# Patient Record
Sex: Female | Born: 1980 | Race: White | Hispanic: No | Marital: Single | Smoking: Current every day smoker
Health system: Southern US, Community
[De-identification: ages and names within clinical notes are randomized; demographics above are authoritative.]

## PROBLEM LIST (undated history)

## (undated) DIAGNOSIS — D649 Anemia, unspecified: Secondary | ICD-10-CM

## (undated) DIAGNOSIS — F419 Anxiety disorder, unspecified: Secondary | ICD-10-CM

## (undated) DIAGNOSIS — E162 Hypoglycemia, unspecified: Secondary | ICD-10-CM

## (undated) DIAGNOSIS — J189 Pneumonia, unspecified organism: Secondary | ICD-10-CM

## (undated) DIAGNOSIS — M1990 Unspecified osteoarthritis, unspecified site: Secondary | ICD-10-CM

## (undated) DIAGNOSIS — K219 Gastro-esophageal reflux disease without esophagitis: Secondary | ICD-10-CM

## (undated) DIAGNOSIS — M797 Fibromyalgia: Secondary | ICD-10-CM

## (undated) HISTORY — PX: TONSILLECTOMY: SUR1361

## (undated) HISTORY — PX: ADENOIDECTOMY: SUR15

## (undated) HISTORY — PX: BELPHAROPTOSIS REPAIR: SHX369

---

## 2015-02-14 ENCOUNTER — Emergency Department (HOSPITAL_COMMUNITY)
Admission: EM | Admit: 2015-02-14 | Discharge: 2015-02-14 | Disposition: A | Discharge status: Home / Self Care | Payer: Self-pay | Attending: Emergency Medicine | Admitting: Emergency Medicine

## 2015-02-14 ENCOUNTER — Encounter (HOSPITAL_COMMUNITY): Payer: Self-pay | Admitting: Emergency Medicine

## 2015-02-14 ENCOUNTER — Emergency Department (HOSPITAL_COMMUNITY): Payer: Self-pay

## 2015-02-14 DIAGNOSIS — S61212A Laceration without foreign body of right middle finger without damage to nail, initial encounter: Secondary | ICD-10-CM | POA: Insufficient documentation

## 2015-02-14 DIAGNOSIS — Y929 Unspecified place or not applicable: Secondary | ICD-10-CM | POA: Insufficient documentation

## 2015-02-14 DIAGNOSIS — Y288XXA Contact with other sharp object, undetermined intent, initial encounter: Secondary | ICD-10-CM | POA: Insufficient documentation

## 2015-02-14 DIAGNOSIS — Z8639 Personal history of other endocrine, nutritional and metabolic disease: Secondary | ICD-10-CM | POA: Insufficient documentation

## 2015-02-14 DIAGNOSIS — IMO0002 Reserved for concepts with insufficient information to code with codable children: Secondary | ICD-10-CM

## 2015-02-14 DIAGNOSIS — Z880 Allergy status to penicillin: Secondary | ICD-10-CM | POA: Insufficient documentation

## 2015-02-14 DIAGNOSIS — Z720 Tobacco use: Secondary | ICD-10-CM | POA: Insufficient documentation

## 2015-02-14 DIAGNOSIS — Z23 Encounter for immunization: Secondary | ICD-10-CM | POA: Insufficient documentation

## 2015-02-14 DIAGNOSIS — Y9389 Activity, other specified: Secondary | ICD-10-CM | POA: Insufficient documentation

## 2015-02-14 DIAGNOSIS — Z8739 Personal history of other diseases of the musculoskeletal system and connective tissue: Secondary | ICD-10-CM | POA: Insufficient documentation

## 2015-02-14 DIAGNOSIS — Y990 Civilian activity done for income or pay: Secondary | ICD-10-CM | POA: Insufficient documentation

## 2015-02-14 HISTORY — DX: Hypoglycemia, unspecified: E16.2

## 2015-02-14 HISTORY — DX: Fibromyalgia: M79.7

## 2015-02-14 LAB — CBG MONITORING, ED: Glucose-Capillary: 156 mg/dL — ABNORMAL HIGH (ref 70–99)

## 2015-02-14 MED ORDER — TETANUS-DIPHTH-ACELL PERTUSSIS 5-2.5-18.5 LF-MCG/0.5 IM SUSP
0.5000 mL | Freq: Once | INTRAMUSCULAR | Status: AC
  Administered 2015-02-14: 0.5 mL via INTRAMUSCULAR
  Filled 2015-02-14: qty 0.5

## 2015-02-14 MED ORDER — LIDOCAINE HCL (PF) 1 % IJ SOLN
5.0000 mL | Freq: Once | INTRAMUSCULAR | Status: AC
  Administered 2015-02-14: 5 mL
  Filled 2015-02-14: qty 5

## 2015-02-14 MED ORDER — CEPHALEXIN 500 MG PO CAPS: 500.0000 mg | ORAL_CAPSULE | Freq: Four times a day (QID) | ORAL | Status: AC

## 2015-02-14 MED ORDER — ALPRAZOLAM 0.25 MG PO TABS
0.2500 mg | ORAL_TABLET | Freq: Once | ORAL | Status: AC
  Administered 2015-02-14: 0.25 mg via ORAL
  Filled 2015-02-14: qty 1

## 2015-02-14 NOTE — ED Provider Notes (Signed)
CSN: [id number]     Arrival date & time 02/14/15  1503 History  This chart was scribed for non-physician practitioner, Raymon Mutton, PA-C working with Gerhard Munch, MD by Greggory Stallion, ED scribe. This patient was seen in room TR11C/TR11C and the patient's care was started at 4:25 PM.   Chief Complaint  Patient presents with  . Laceration   The history is provided by the patient. No language interpreter was used.    HPI Comments: Melissa Montgomery is a 34 y.o. female with past medical history of hypoglycemia and fibromyalgia who presents to the Emergency Department complaining of right middle finger laceration that occurred about 2 hours ago. She states she was trying to break a metal piece at work and accidentally cut her finger on it. Pt cleaned and bandaged the wound immediately afterwards. She reports numbness and tingling in the finger with decreased ROM to the finger. Her last tetanus was in 2007. She denies wrist pain, injury to finger in past. PCP none   Past Medical History  Diagnosis Date  . Fibromyalgia   . Hypoglycemia    Past Surgical History  Procedure Laterality Date  . Tonsillectomy    . Belpharoptosis repair     No family history on file. History  Substance Use Topics  . Smoking status: Current Every Day Smoker  . Smokeless tobacco: Not on file  . Alcohol Use: Yes   OB History    No data available     Review of Systems  Musculoskeletal: Negative for arthralgias.  Skin: Positive for wound.  Neurological: Positive for numbness.  All other systems reviewed and are negative.  Allergies  Amoxicillin; Codone; and Other  Home Medications   Prior to Admission medications   Medication Sig Start Date End Date Taking? Authorizing Provider  cephALEXin (KEFLEX) 500 MG capsule Take 1 capsule (500 mg total) by mouth 4 (four) times daily. 02/14/15   Ervey Fallin, PA-C   BP 113/76 mmHg  Pulse 87  Temp(Src) 98.2 F (36.8 C) (Oral)  Resp 16  Ht  (1.575 m)   Wt 145 lb (65.772 kg)  BMI 26.51 kg/m2  SpO2 99%  LMP 01/14/2015 (Approximate)   Physical Exam  Constitutional: She is oriented to person, place, and time. She appears well-developed and well-nourished. No distress.  HENT:  Head: Normocephalic and atraumatic.  Eyes: Conjunctivae and EOM are normal.  Neck: Normal range of motion. Neck supple.  Cardiovascular: Normal rate, regular rhythm and normal heart sounds.  Exam reveals no gallop and no friction rub.   No murmur heard. Pulses:      Radial pulses are 2+ on the right side, and 2+ on the left side.  Cap refill < 3 seconds  Pulmonary/Chest: Effort normal and breath sounds normal. No respiratory distress. She has no wheezes. She has no rhonchi. She has no rales.  Musculoskeletal:  Decreased flexion of the right middle finger at the PIP joint   Neurological: She is alert and oriented to person, place, and time.  Decreased sensation to the right middle finger, ulnar aspect  Decreased strength - patient unable to produce a fist  Skin: Skin is warm and dry.  Deep laceration at the PIP, flexor surface of the right middle finger measuring approximately 1.5 inches  Please see photo below.   Psychiatric: She has a normal mood and affect. Her behavior is normal.  Nursing note and vitals reviewed.   ED Course  Procedures (including critical care time)  DIAGNOSTIC STUDIES: Oxygen Saturation  is 99% on RA, normal by my interpretation.    COORDINATION OF CARE: 4:29 PM-Will xray finger, update tetanus, and consult hand surgery. Pt is agreeable to plan.   Results for orders placed or performed during the hospital encounter of 02/14/15  CBG monitoring, ED  Result Value Ref Range   Glucose-Capillary 156 (H) 70 - 99 mg/dL    Labs Review Labs Reviewed  CBG MONITORING, ED - Abnormal; Notable for the following:    Glucose-Capillary 156 (*)    All other components within normal limits    Imaging Review No results found.   EKG  Interpretation None       LACERATION REPAIR Performed by: Raymon Mutton Authorized by: Raymon Mutton Consent: Verbal consent obtained. Risks and benefits: risks, benefits and alternatives were discussed Consent given by: patient Patient identity confirmed: provided demographic data Prepped and Draped in normal sterile fashion Wound explored Laceration Location: right middle finger, PIP joint Laceration Length: 1.5 in  No Foreign Bodies seen or palpated Amount of cleaning:extensive Skin closure: loose Number of sutures: 4 steri-strips Patient tolerance: Patient tolerated the procedure well with no immediate complications.      5:12 PM This provider spoke with Dr. Orlan Leavens, on-call physician for hand specialist. Discussed case in great detail, physician reviewed imaging. As per physician recommended patient to have maximum of 2 stitches placed, finger splint to be placed in dorsal flexed position and for patient to follow-up in his office first thing tomorrow.  5:34 PM Dr. Billey Chang at bedside.   6:04 PM Patient anxious. Patient crying. Patient does not one stitches to be placed secondary to traumatic event from previous laceration repair that was done 4 years ago. Patient started crying. Patient declines and refuses to have stitches placed. This provider discussed with patient importance. Patient continues to refuse.  6:08 PM Dr. Jeraldine Loots at bedside. Recommended 0.25 mg of Xanax to be administered to patient. Recommended Steri-Strips to be placed instead of stitches and for patient to be placed in finger splint.  7:19 PM Patient reported that she is able to take penicillins without consequences.  MDM   Final diagnoses:  Laceration    Medications  Tdap (BOOSTRIX) injection 0.5 mL (0.5 mLs Intramuscular Given 02/14/15 1623)  lidocaine (PF) (XYLOCAINE) 1 % injection 5 mL (5 mLs Infiltration Given 02/14/15 1624)  ALPRAZolam (XANAX) tablet 0.25 mg (0.25 mg Oral Given  02/14/15 1821)    Filed Vitals:   02/14/15 1509 02/14/15 1634  BP: 113/76   Pulse: 87   Temp: 98.2 F (36.8 C)   TempSrc: Oral   Resp: 18 16  Height:  (1.575 m)   Weight: 145 lb (65.772 kg)   SpO2: 99%     I personally performed the services described in this documentation, which was scribed in my presence. The recorded information has been reviewed and is accurate.  Patient presenting to the ED with laceration at the PIP joint of the right middle finger, flexor surface. Decreased sensation identified to the ulnar aspect of the right middle finger-difficulty with differentiation to sharp and dull touch. Appears to have flexor tendon disruption. Bleeding controlled. Cap refill less than 3 seconds. Radial pulses palpable and strong. Plain film of right middle finger unremarkable findings. Hand surgery consulted-case reviewed in great detail with Dr. Jacqulyn Ducking per physician recommended maximum of 2 stitches to be placed in patient to be placed in finger splint. Recommended patient to be followed up in outpatient office first thing tomorrow-reported the patient most likely  need surgery. Wound thoroughly cleaned-hand soaked and irrigated. Tetanus updated. Steri-strips placed - patient refused stitches, splint placed. Patient stable, afebrile. Patient not septic appearing. Discharged patient. Discharged patient with antibiotics. Discussed with patient to follow-up with hand specialist. Discussed with patient to closely monitor symptoms and if symptoms are to worsen or change to report back to the ED - strict return instructions given.  Patient agreed to plan of care, understood, all questions answered.   Raymon MuttonMarissa Levy Cedano, PA-C 02/14/15 1923  Gerhard Munchobert Lockwood, MD 02/15/15 [phone]

## 2015-02-14 NOTE — Discharge Instructions (Signed)
Please follow-up with Dr. Orlan Leavens first thing tomorrow, physician is expecting you for repair of the right middle finger Please keep hand elevated-above heart level Please rest and stay hydrated Please avoid getting the splint wet Please take antibiotics as prescribed and on a full stomach Please continue to monitor symptoms closely and if symptoms are to worsen or change (fever greater than 101, chills, sweating, nausea, vomiting, chest pain, shortness of breathe, difficulty breathing, weakness, numbness, tingling, worsening or changes to pain pattern, bleeding to the wound, changes to color to the fingertip) please report back to the Emergency Department immediately.   Laceration Care, Adult A laceration is a cut or lesion that goes through all layers of the skin and into the tissue just beneath the skin. TREATMENT  Some lacerations may not require closure. Some lacerations may not be able to be closed due to an increased risk of infection. It is important to see your caregiver as soon as possible after an injury to minimize the risk of infection and maximize the opportunity for successful closure. If closure is appropriate, pain medicines may be given, if needed. The wound will be cleaned to help prevent infection. Your caregiver will use stitches (sutures), staples, wound glue (adhesive), or skin adhesive strips to repair the laceration. These tools bring the skin edges together to allow for faster healing and a better cosmetic outcome. However, all wounds will heal with a scar. Once the wound has healed, scarring can be minimized by covering the wound with sunscreen during the day for 1 full year. HOME CARE INSTRUCTIONS  For sutures or staples:  Keep the wound clean and dry.  If you were given a bandage (dressing), you should change it at least once a day. Also, change the dressing if it becomes wet or dirty, or as directed by your caregiver.  Wash the wound with soap and water 2 times a day.  Rinse the wound off with water to remove all soap. Pat the wound dry with a clean towel.  After cleaning, apply a thin layer of the antibiotic ointment as recommended by your caregiver. This will help prevent infection and keep the dressing from sticking.  You may shower as usual after the first 24 hours. Do not soak the wound in water until the sutures are removed.  Only take over-the-counter or prescription medicines for pain, discomfort, or fever as directed by your caregiver.  Get your sutures or staples removed as directed by your caregiver. For skin adhesive strips:  Keep the wound clean and dry.  Do not get the skin adhesive strips wet. You may bathe carefully, using caution to keep the wound dry.  If the wound gets wet, pat it dry with a clean towel.  Skin adhesive strips will fall off on their own. You may trim the strips as the wound heals. Do not remove skin adhesive strips that are still stuck to the wound. They will fall off in time. For wound adhesive:  You may briefly wet your wound in the shower or bath. Do not soak or scrub the wound. Do not swim. Avoid periods of heavy perspiration until the skin adhesive has fallen off on its own. After showering or bathing, gently pat the wound dry with a clean towel.  Do not apply liquid medicine, cream medicine, or ointment medicine to your wound while the skin adhesive is in place. This may loosen the film before your wound is healed.  If a dressing is placed over the wound, be  careful not to apply tape directly over the skin adhesive. This may cause the adhesive to be pulled off before the wound is healed.  Avoid prolonged exposure to sunlight or tanning lamps while the skin adhesive is in place. Exposure to ultraviolet light in the first year will darken the scar.  The skin adhesive will usually remain in place for 5 to 10 days, then naturally fall off the skin. Do not pick at the adhesive film. You may need a tetanus shot  if:  You cannot remember when you had your last tetanus shot.  You have never had a tetanus shot. If you get a tetanus shot, your arm may swell, get red, and feel warm to the touch. This is common and not a problem. If you need a tetanus shot and you choose not to have one, there is a rare chance of getting tetanus. Sickness from tetanus can be serious. SEEK MEDICAL CARE IF:   You have redness, swelling, or increasing pain in the wound.  You see a red line that goes away from the wound.  You have yellowish-white fluid (pus) coming from the wound.  You have a fever.  You notice a bad smell coming from the wound or dressing.  Your wound breaks open before or after sutures have been removed.  You notice something coming out of the wound such as wood or glass.  Your wound is on your hand or foot and you cannot move a finger or toe. SEEK IMMEDIATE MEDICAL CARE IF:   Your pain is not controlled with prescribed medicine.  You have severe swelling around the wound causing pain and numbness or a change in color in your arm, hand, leg, or foot.  Your wound splits open and starts bleeding.  You have worsening numbness, weakness, or loss of function of any joint around or beyond the wound.  You develop painful lumps near the wound or on the skin anywhere on your body. MAKE SURE YOU:   Understand these instructions.  Will watch your condition.  Will get help right away if you are not doing well or get worse. Document Released: 12/03/2005 Document Revised: 02/25/2012 Document Reviewed: 05/29/2011 [facility] Patient Information 2015 [place], [place]. This information is not intended to replace advice given to you by your health care provider. Make sure you discuss any questions you have with your health care provider.

## 2015-02-14 NOTE — ED Notes (Signed)
Pt reports cutting R middle finger with metal. Pt with bandage in place. PMS intact.

## 2015-02-15 ENCOUNTER — Encounter (HOSPITAL_COMMUNITY): Payer: Self-pay | Admitting: *Deleted

## 2015-02-15 MED ORDER — CLINDAMYCIN PHOSPHATE 900 MG/50ML IV SOLN
900.0000 mg | INTRAVENOUS | Status: AC
  Administered 2015-02-16: 900 mg via INTRAVENOUS
  Filled 2015-02-15: qty 50

## 2015-02-15 NOTE — H&P (Signed)
Melissa Montgomery is an 34 y.o. female.   Chief Complaint: right long finger laceration HPI: ED notes reflect history Pt with laceration to long finger Pt here for surgery   Past Medical History  Diagnosis Date  . Fibromyalgia   . Hypoglycemia     Past Surgical History  Procedure Laterality Date  . Tonsillectomy    . Belpharoptosis repair      No family history on file. Social History:  reports that she has been smoking.  She does not have any smokeless tobacco history on file. She reports that she drinks alcohol. She reports that she does not use illicit drugs.  Allergies:  Allergies  Allergen Reactions  . Amoxicillin   . Codone [Hydrocodone]   . Other     All steroids.     No prescriptions prior to admission    Results for orders placed or performed during the hospital encounter of 02/14/15 (from the past 48 hour(s))  CBG monitoring, ED     Status: Abnormal   Collection Time: 02/14/15  3:12 PM  Result Value Ref Range   Glucose-Capillary 156 (H) 70 - 99 mg/dL   Dg Finger Middle Right  02/14/2015   CLINICAL DATA:  Laceration to the right middle finger with metal. Initial encounter.  EXAM: RIGHT MIDDLE FINGER 2+V  COMPARISON:  None.  FINDINGS: There is no evidence of fracture or dislocation. A soft tissue defect is noted volar to the third proximal interphalangeal joint. No radiopaque foreign bodies are seen. The joint space is not fully characterized. Visualized joint spaces appear grossly intact.  IMPRESSION: No evidence of fracture or dislocation. No radiopaque foreign bodies seen.   Electronically Signed   By: Roanna RaiderJeffery  Chang M.D.   On: 02/14/2015 17:21    ROS NO RECENT ILLNESSES OR HOSPITALIZATIONS   Last menstrual period 01/14/2015. Physical Exam  General Appearance:  Alert, cooperative, no distress, appears stated age  Head:  Normocephalic, without obvious abnormality, atraumatic  Eyes:  Pupils equal, conjunctiva/corneas clear,         Throat: Lips, mucosa, and  tongue normal; teeth and gums normal  Neck: No visible masses     Lungs:   respirations unlabored  Chest Wall:  No tenderness or deformity  Heart:  Regular rate and rhythm,  Abdomen:   Soft, non-tender,         Extremities: RIGHT LONG FINGER IN SPLINT, PICTURES IN CHART. FINGER TIP WARM WELL PERFUSED LIMITED DIGITAL MOTION ABLE TO MOVE INDEX RING AND SMALL GOOD THUMB MOBILITY  Pulses: 2+ and symmetric  Skin: Skin color, texture, turgor normal, no rashes or lesions     Neurologic: Normal    Assessment/Plan RIGHT LONG FINGER LACERATION WITH TENDON INVOLVEMENT AND NERVE INVOLVEMENT  RIGHT LONG FINGER WOUND EXPLORATION AND TENDON AND NERVE REPAIR AS INDICATED  R/B/A DISCUSSED WITH PT IN OFFICE.  PT VOICED UNDERSTANDING OF PLAN CONSENT SIGNED DAY OF SURGERY PT SEEN AND EXAMINED PRIOR TO OPERATIVE PROCEDURE/DAY OF SURGERY SITE MARKED. QUESTIONS ANSWERED WILL GO HOME FOLLOWING SURGERY  WE ARE PLANNING SURGERY FOR YOUR UPPER EXTREMITY. THE RISKS AND BENEFITS OF SURGERY INCLUDE BUT NOT LIMITED TO BLEEDING INFECTION, DAMAGE TO NEARBY NERVES ARTERIES TENDONS, FAILURE OF SURGERY TO ACCOMPLISH ITS INTENDED GOALS, PERSISTENT SYMPTOMS AND NEED FOR FURTHER SURGICAL INTERVENTION. WITH THIS IN MIND WE WILL PROCEED. I HAVE DISCUSSED WITH THE PATIENT THE PRE AND POSTOPERATIVE REGIMEN AND THE DOS AND DON'TS. PT VOICED UNDERSTANDING AND INFORMED CONSENT SIGNED.  Sharma CovertORTMANN,Jquan Egelston W 02/15/2015, 3:09 PM

## 2015-02-15 NOTE — Brief Op Note (Signed)
02/16/2015  3:15 PM  PATIENT:  Melissa Montgomery  34 y.o. female  PRE-OPERATIVE DIAGNOSIS:  RIGHT MIDDLE FINGER LACERATION WITH TENDON NERVE   POST-OPERATIVE DIAGNOSIS:  * No post-op diagnosis entered *  PROCEDURE:  Procedure(s): RIGHT MIDDLE FINGER WOUND EXPLORATION (Right) NERVE AND TENDON REPAIR (Right)  SURGEON:  Surgeon(s) and Role:    * Sharma CovertFred W Dyane Broberg, MD - Primary  PHYSICIAN ASSISTANT:   ASSISTANTS: none   ANESTHESIA:   general  EBL:     BLOOD ADMINISTERED:none  DRAINS: none   LOCAL MEDICATIONS USED:  MARCAINE     SPECIMEN:  No Specimen  DISPOSITION OF SPECIMEN:  N/A  COUNTS:  YES  TOURNIQUET:  * No tourniquets in log *  DICTATION: .Other Dictation: Dictation Number [id number]  PLAN OF CARE: Discharge to home after PACU  PATIENT DISPOSITION:  PACU - hemodynamically stable.   Delay start of Pharmacological VTE agent (>24hrs) due to surgical blood loss or risk of bleeding: not applicable

## 2015-02-15 NOTE — Progress Notes (Signed)
Pt denies SOB, chest pain, and being under the care of a cardiologist. Pt denies having a stress test, echo, cardiac cath, EKG and chest x ray. Pt made aware to stop taking Aspirin, otc vitamins, and herbal medications. Do not take any NSAIDs ie: Ibuprofen, Advil, Naproxen or any medication containing Aspirin.

## 2015-02-16 ENCOUNTER — Ambulatory Visit (HOSPITAL_COMMUNITY): Payer: Self-pay | Admitting: Anesthesiology

## 2015-02-16 ENCOUNTER — Encounter (HOSPITAL_COMMUNITY): Payer: Self-pay | Admitting: *Deleted

## 2015-02-16 ENCOUNTER — Encounter (HOSPITAL_COMMUNITY)
Admission: RE | Disposition: A | Discharge status: Home / Self Care | Payer: Self-pay | Source: Ambulatory Visit | Attending: Orthopedic Surgery

## 2015-02-16 ENCOUNTER — Ambulatory Visit (HOSPITAL_COMMUNITY)
Admission: RE | Admit: 2015-02-16 | Discharge: 2015-02-17 | Disposition: A | Discharge status: Home / Self Care | Payer: Self-pay | Source: Ambulatory Visit | Attending: Orthopedic Surgery | Admitting: Orthopedic Surgery

## 2015-02-16 DIAGNOSIS — S56123A Laceration of flexor muscle, fascia and tendon of right middle finger at forearm level, initial encounter: Secondary | ICD-10-CM | POA: Insufficient documentation

## 2015-02-16 DIAGNOSIS — F17210 Nicotine dependence, cigarettes, uncomplicated: Secondary | ICD-10-CM | POA: Insufficient documentation

## 2015-02-16 DIAGNOSIS — Z881 Allergy status to other antibiotic agents status: Secondary | ICD-10-CM | POA: Insufficient documentation

## 2015-02-16 DIAGNOSIS — K219 Gastro-esophageal reflux disease without esophagitis: Secondary | ICD-10-CM | POA: Insufficient documentation

## 2015-02-16 DIAGNOSIS — M1990 Unspecified osteoarthritis, unspecified site: Secondary | ICD-10-CM | POA: Insufficient documentation

## 2015-02-16 DIAGNOSIS — F419 Anxiety disorder, unspecified: Secondary | ICD-10-CM | POA: Insufficient documentation

## 2015-02-16 DIAGNOSIS — Y929 Unspecified place or not applicable: Secondary | ICD-10-CM | POA: Insufficient documentation

## 2015-02-16 DIAGNOSIS — S61419A Laceration without foreign body of unspecified hand, initial encounter: Secondary | ICD-10-CM | POA: Diagnosis present

## 2015-02-16 DIAGNOSIS — M797 Fibromyalgia: Secondary | ICD-10-CM | POA: Insufficient documentation

## 2015-02-16 DIAGNOSIS — Z885 Allergy status to narcotic agent status: Secondary | ICD-10-CM | POA: Insufficient documentation

## 2015-02-16 DIAGNOSIS — D649 Anemia, unspecified: Secondary | ICD-10-CM | POA: Insufficient documentation

## 2015-02-16 DIAGNOSIS — S66929A Laceration of unspecified muscle, fascia and tendon at wrist and hand level, unspecified hand, initial encounter: Secondary | ICD-10-CM

## 2015-02-16 DIAGNOSIS — E162 Hypoglycemia, unspecified: Secondary | ICD-10-CM | POA: Insufficient documentation

## 2015-02-16 DIAGNOSIS — W458XXA Other foreign body or object entering through skin, initial encounter: Secondary | ICD-10-CM | POA: Insufficient documentation

## 2015-02-16 DIAGNOSIS — S64492A Injury of digital nerve of right middle finger, initial encounter: Secondary | ICD-10-CM | POA: Insufficient documentation

## 2015-02-16 HISTORY — DX: Unspecified osteoarthritis, unspecified site: M19.90

## 2015-02-16 HISTORY — PX: NERVE AND TENDON REPAIR: SHX5693

## 2015-02-16 HISTORY — DX: Anxiety disorder, unspecified: F41.9

## 2015-02-16 HISTORY — DX: Anemia, unspecified: D64.9

## 2015-02-16 HISTORY — DX: Gastro-esophageal reflux disease without esophagitis: K21.9

## 2015-02-16 HISTORY — PX: WOUND EXPLORATION: SHX6188

## 2015-02-16 HISTORY — DX: Pneumonia, unspecified organism: J18.9

## 2015-02-16 LAB — CBC
HCT: 39.2 % (ref 36.0–46.0)
HEMOGLOBIN: 13.3 g/dL (ref 12.0–15.0)
MCH: 31.6 pg (ref 26.0–34.0)
MCHC: 33.9 g/dL (ref 30.0–36.0)
MCV: 93.1 fL (ref 78.0–100.0)
Platelets: 381 10*3/uL (ref 150–400)
RBC: 4.21 MIL/uL (ref 3.87–5.11)
RDW: 13 % (ref 11.5–15.5)
WBC: 9.8 10*3/uL (ref 4.0–10.5)

## 2015-02-16 LAB — HCG, SERUM, QUALITATIVE: Preg, Serum: NEGATIVE

## 2015-02-16 SURGERY — WOUND EXPLORATION
Anesthesia: General | Site: Finger | Laterality: Right

## 2015-02-16 MED ORDER — FENTANYL CITRATE 0.05 MG/ML IJ SOLN
INTRAMUSCULAR | Status: DC | PRN
  Administered 2015-02-16 (×3): 50 ug via INTRAVENOUS
  Administered 2015-02-16: 100 ug via INTRAVENOUS

## 2015-02-16 MED ORDER — VITAMIN C 500 MG PO TABS
1000.0000 mg | ORAL_TABLET | Freq: Every day | ORAL | Status: DC
Start: 2015-02-17 — End: 2015-02-17
  Filled 2015-02-16: qty 2

## 2015-02-16 MED ORDER — OXYCODONE HCL 5 MG/5ML PO SOLN: 5.0000 mg | Freq: Once | ORAL | Status: DC | PRN

## 2015-02-16 MED ORDER — HYDROCODONE-ACETAMINOPHEN 5-300 MG PO TABS: 1.0000 | ORAL_TABLET | Freq: Four times a day (QID) | ORAL | Status: AC | PRN

## 2015-02-16 MED ORDER — ONDANSETRON HCL 4 MG PO TABS: 4.0000 mg | ORAL_TABLET | Freq: Four times a day (QID) | ORAL | Status: DC | PRN

## 2015-02-16 MED ORDER — HYDROMORPHONE HCL 1 MG/ML IJ SOLN: 0.2500 mg | INTRAMUSCULAR | Status: DC | PRN

## 2015-02-16 MED ORDER — FENTANYL CITRATE 0.05 MG/ML IJ SOLN
INTRAMUSCULAR | Status: AC
  Filled 2015-02-16: qty 5

## 2015-02-16 MED ORDER — CLINDAMYCIN PHOSPHATE 600 MG/50ML IV SOLN
600.0000 mg | Freq: Three times a day (TID) | INTRAVENOUS | Status: DC
  Administered 2015-02-17 (×2): 600 mg via INTRAVENOUS
  Filled 2015-02-16 (×3): qty 50

## 2015-02-16 MED ORDER — METHOCARBAMOL 1000 MG/10ML IJ SOLN
500.0000 mg | Freq: Four times a day (QID) | INTRAVENOUS | Status: DC | PRN
  Filled 2015-02-16: qty 5

## 2015-02-16 MED ORDER — PHENYLEPHRINE HCL 10 MG/ML IJ SOLN
INTRAMUSCULAR | Status: DC | PRN
  Administered 2015-02-16: 80 ug via INTRAVENOUS
  Administered 2015-02-16 (×3): 40 ug via INTRAVENOUS

## 2015-02-16 MED ORDER — ADULT MULTIVITAMIN W/MINERALS CH
1.0000 | ORAL_TABLET | Freq: Every day | ORAL | Status: DC
  Filled 2015-02-16: qty 1

## 2015-02-16 MED ORDER — BUPIVACAINE-EPINEPHRINE (PF) 0.25% -1:200000 IJ SOLN
INTRAMUSCULAR | Status: AC
  Filled 2015-02-16: qty 30

## 2015-02-16 MED ORDER — 0.9 % SODIUM CHLORIDE (POUR BTL) OPTIME
TOPICAL | Status: DC | PRN
  Administered 2015-02-16: 1000 mL

## 2015-02-16 MED ORDER — MIDAZOLAM HCL 2 MG/2ML IJ SOLN
INTRAMUSCULAR | Status: AC
  Filled 2015-02-16: qty 2

## 2015-02-16 MED ORDER — DOCUSATE SODIUM 100 MG PO CAPS
100.0000 mg | ORAL_CAPSULE | Freq: Two times a day (BID) | ORAL | Status: DC
Start: 2015-02-16 — End: 2015-02-17
  Filled 2015-02-16: qty 1

## 2015-02-16 MED ORDER — LIDOCAINE HCL (CARDIAC) 20 MG/ML IV SOLN
INTRAVENOUS | Status: DC | PRN
  Administered 2015-02-16: 80 mg via INTRAVENOUS

## 2015-02-16 MED ORDER — CHLORHEXIDINE GLUCONATE 4 % EX LIQD: 60.0000 mL | Freq: Once | CUTANEOUS | Status: DC

## 2015-02-16 MED ORDER — OXYCODONE-ACETAMINOPHEN 5-325 MG PO TABS
1.0000 | ORAL_TABLET | ORAL | Status: DC | PRN
  Administered 2015-02-17: 2 via ORAL
  Filled 2015-02-16: qty 2

## 2015-02-16 MED ORDER — MORPHINE SULFATE 2 MG/ML IJ SOLN: 1.0000 mg | INTRAMUSCULAR | Status: DC | PRN

## 2015-02-16 MED ORDER — KCL IN DEXTROSE-NACL 20-5-0.45 MEQ/L-%-% IV SOLN
INTRAVENOUS | Status: DC
  Administered 2015-02-16: 21:00:00 via INTRAVENOUS
  Filled 2015-02-16 (×2): qty 1000

## 2015-02-16 MED ORDER — ONDANSETRON HCL 4 MG/2ML IJ SOLN: 4.0000 mg | Freq: Four times a day (QID) | INTRAMUSCULAR | Status: DC | PRN

## 2015-02-16 MED ORDER — ONDANSETRON HCL 4 MG/2ML IJ SOLN
INTRAMUSCULAR | Status: DC | PRN
Start: 2015-02-16 — End: 2015-02-16
  Administered 2015-02-16: 4 mg via INTRAVENOUS

## 2015-02-16 MED ORDER — OXYCODONE HCL 5 MG PO TABS: 5.0000 mg | ORAL_TABLET | Freq: Once | ORAL | Status: DC | PRN

## 2015-02-16 MED ORDER — LACTATED RINGERS IV SOLN
INTRAVENOUS | Status: DC | PRN
  Administered 2015-02-16 (×2): via INTRAVENOUS

## 2015-02-16 MED ORDER — PROPOFOL 10 MG/ML IV BOLUS
INTRAVENOUS | Status: DC | PRN
  Administered 2015-02-16: 200 mg via INTRAVENOUS

## 2015-02-16 MED ORDER — ALPRAZOLAM 0.5 MG PO TABS: 0.5000 mg | ORAL_TABLET | Freq: Four times a day (QID) | ORAL | Status: DC | PRN

## 2015-02-16 MED ORDER — ONDANSETRON HCL 4 MG/2ML IJ SOLN: 4.0000 mg | Freq: Once | INTRAMUSCULAR | Status: DC | PRN

## 2015-02-16 MED ORDER — MIDAZOLAM HCL 5 MG/5ML IJ SOLN
INTRAMUSCULAR | Status: DC | PRN
  Administered 2015-02-16 (×2): 1 mg via INTRAVENOUS

## 2015-02-16 MED ORDER — ZOLPIDEM TARTRATE 5 MG PO TABS: 5.0000 mg | ORAL_TABLET | Freq: Every evening | ORAL | Status: DC | PRN

## 2015-02-16 MED ORDER — HYDROCODONE-ACETAMINOPHEN 5-325 MG PO TABS: 1.0000 | ORAL_TABLET | ORAL | Status: DC | PRN

## 2015-02-16 MED ORDER — DIPHENHYDRAMINE HCL 25 MG PO CAPS: 25.0000 mg | ORAL_CAPSULE | Freq: Four times a day (QID) | ORAL | Status: DC | PRN

## 2015-02-16 MED ORDER — LACTATED RINGERS IV SOLN
INTRAVENOUS | Status: DC
  Administered 2015-02-16: 16:00:00 via INTRAVENOUS

## 2015-02-16 MED ORDER — METHOCARBAMOL 500 MG PO TABS: 500.0000 mg | ORAL_TABLET | Freq: Four times a day (QID) | ORAL | Status: DC | PRN

## 2015-02-16 MED ORDER — BUPIVACAINE-EPINEPHRINE 0.25% -1:200000 IJ SOLN
INTRAMUSCULAR | Status: DC | PRN
  Administered 2015-02-16: 7 mL

## 2015-02-16 SURGICAL SUPPLY — 63 items
BANDAGE ELASTIC 3 VELCRO ST LF (GAUZE/BANDAGES/DRESSINGS) IMPLANT
BANDAGE ELASTIC 4 VELCRO ST LF (GAUZE/BANDAGES/DRESSINGS) IMPLANT
BLADE SURG 15 STRL LF DISP TIS (BLADE) IMPLANT
BLADE SURG 15 STRL SS (BLADE)
BNDG COHESIVE 1X5 TAN STRL LF (GAUZE/BANDAGES/DRESSINGS) ×3 IMPLANT
BNDG CONFORM 2 STRL LF (GAUZE/BANDAGES/DRESSINGS) ×3 IMPLANT
BNDG ESMARK 4X9 LF (GAUZE/BANDAGES/DRESSINGS) ×3 IMPLANT
BNDG GAUZE ELAST 4 BULKY (GAUZE/BANDAGES/DRESSINGS) IMPLANT
CORDS BIPOLAR (ELECTRODE) ×3 IMPLANT
COVER MAYO STAND STRL (DRAPES) ×3 IMPLANT
CUFF TOURNIQUET SINGLE 18IN (TOURNIQUET CUFF) ×3 IMPLANT
DRAPE SURG 17X11 SM STRL (DRAPES) ×6 IMPLANT
DRAPE SURG 17X23 STRL (DRAPES) ×3 IMPLANT
DRSG EMULSION OIL 3X3 NADH (GAUZE/BANDAGES/DRESSINGS) ×3 IMPLANT
DRSG PAD ABDOMINAL 8X10 ST (GAUZE/BANDAGES/DRESSINGS) IMPLANT
ELECT REM PT RETURN 9FT ADLT (ELECTROSURGICAL) ×3
ELECTRODE REM PT RTRN 9FT ADLT (ELECTROSURGICAL) ×1 IMPLANT
GAUZE SPONGE 4X4 12PLY STRL (GAUZE/BANDAGES/DRESSINGS) IMPLANT
GLOVE BIOGEL PI IND STRL 6.5 (GLOVE) ×1 IMPLANT
GLOVE BIOGEL PI IND STRL 8.5 (GLOVE) ×1 IMPLANT
GLOVE BIOGEL PI INDICATOR 6.5 (GLOVE) ×2
GLOVE BIOGEL PI INDICATOR 8.5 (GLOVE) ×2
GLOVE SURG ORTHO 8.0 STRL STRW (GLOVE) ×3 IMPLANT
GOWN STRL REUS W/ TWL XL LVL3 (GOWN DISPOSABLE) ×1 IMPLANT
GOWN STRL REUS W/TWL 2XL LVL3 (GOWN DISPOSABLE) ×3 IMPLANT
GOWN STRL REUS W/TWL XL LVL3 (GOWN DISPOSABLE) ×2
IV NS IRRIG 3000ML ARTHROMATIC (IV SOLUTION) IMPLANT
KIT BASIN OR (CUSTOM PROCEDURE TRAY) ×3 IMPLANT
LOOP VESSEL MAXI BLUE (MISCELLANEOUS) IMPLANT
LOOP VESSEL MINI RED (MISCELLANEOUS) IMPLANT
MANIFOLD NEPTUNE II (INSTRUMENTS) IMPLANT
NEEDLE HYPO 25X1 1.5 SAFETY (NEEDLE) IMPLANT
NS IRRIG 1000ML POUR BTL (IV SOLUTION) ×3 IMPLANT
PACK ORTHO EXTREMITY (CUSTOM PROCEDURE TRAY) ×3 IMPLANT
PAD CAST 4YDX4 CTTN HI CHSV (CAST SUPPLIES) IMPLANT
PADDING CAST ABS 4INX4YD NS (CAST SUPPLIES)
PADDING CAST ABS COTTON 4X4 ST (CAST SUPPLIES) IMPLANT
PADDING CAST COTTON 4X4 STRL (CAST SUPPLIES)
SET CYSTO W/LG BORE CLAMP LF (SET/KITS/TRAYS/PACK) IMPLANT
SPEAR EYE SURG WECK-CEL (MISCELLANEOUS) ×3 IMPLANT
SPONGE GAUZE 4X4 12PLY STER LF (GAUZE/BANDAGES/DRESSINGS) ×3 IMPLANT
SUCTION FRAZIER TIP 10 FR DISP (SUCTIONS) IMPLANT
SUT ETHIBOND 3-0 V-5 (SUTURE) IMPLANT
SUT ETHILON 4 0 PS 2 18 (SUTURE) ×3 IMPLANT
SUT ETHILON 8 0 TG100 8 (SUTURE) ×3 IMPLANT
SUT MERSILENE 4 0 P 3 (SUTURE) ×3 IMPLANT
SUT PROLENE 3 0 PS 2 (SUTURE) IMPLANT
SUT PROLENE 4 0 PS 2 18 (SUTURE) IMPLANT
SUT VIC AB 1 CT1 27 (SUTURE) ×2
SUT VIC AB 1 CT1 27XBRD ANTBC (SUTURE) ×1 IMPLANT
SUT VIC AB 2-0 CT1 27 (SUTURE) ×2
SUT VIC AB 2-0 CT1 27XBRD (SUTURE) ×1 IMPLANT
SUT VIC AB 2-0 SH 27 (SUTURE)
SUT VIC AB 2-0 SH 27XBRD (SUTURE) IMPLANT
SUT VICRYL 4-0 PS2 18IN ABS (SUTURE) ×3 IMPLANT
SUT VICRYL RAPIDE 4/0 PS 2 (SUTURE) ×6 IMPLANT
SYR 20CC LL (SYRINGE) ×3 IMPLANT
SYR CONTROL 10ML LL (SYRINGE) ×3 IMPLANT
TOWEL OR 17X26 10 PK STRL BLUE (TOWEL DISPOSABLE) ×3 IMPLANT
TUBE CONNECTING 12'X1/4 (SUCTIONS)
TUBE CONNECTING 12X1/4 (SUCTIONS) IMPLANT
UNDERPAD 30X30 INCONTINENT (UNDERPADS AND DIAPERS) ×3 IMPLANT
WATER STERILE IRR 1000ML POUR (IV SOLUTION) ×3 IMPLANT

## 2015-02-16 NOTE — Discharge Instructions (Signed)
KEEP BANDAGE CLEAN AND DRY CALL OFFICE FOR F/U APPT [phone] IN 14 DAYS KEEP HAND ELEVATED ABOVE HEART OK TO APPLY ICE TO OPERATIVE AREA CONTACT OFFICE IF ANY WORSENING PAIN OR CONCERNS. 

## 2015-02-16 NOTE — Progress Notes (Signed)
Orthopedic Tech Progress Note Patient Details:  [name] [birth year] [id number]  Ortho Devices Ortho Device/Splint Location: Rica Mastkuzman sling Ortho Device/Splint Interventions: Ordered, Application   Jennye MoccasinHughes, Kadience Macchi Craig 02/16/2015, 8:18 PM

## 2015-02-16 NOTE — Anesthesia Procedure Notes (Addendum)
Procedure Name: LMA Insertion Date/Time: 02/16/2015 6:23 PM Performed by: Leonel Ramsay'LAUGHLIN, Taryll Reichenberger H Pre-anesthesia Checklist: Patient identified, Timeout performed, Emergency Drugs available, Suction available and Patient being monitored Patient Re-evaluated:Patient Re-evaluated prior to inductionOxygen Delivery Method: Circle system utilized Preoxygenation: Pre-oxygenation with 100% oxygen Intubation Type: IV induction LMA: LMA inserted LMA Size: 4.0 Number of attempts: 1 Placement Confirmation: positive ETCO2 and breath sounds checked- equal and bilateral Tube secured with: Tape Dental Injury: Teeth and Oropharynx as per pre-operative assessment

## 2015-02-16 NOTE — Consult Note (Signed)
PHARMACY CONSULT NOTE--POST-PROCEDURE ANTIBIOTICS   Consult  :  Post Procedure Dosing of Clindamycin Indication :  Empiric Post-Procedure Prophylaxis   Pharmacy consulted for post-procedure dosing of Clindamycin s/p R-middle finger wound exploration, nerve and tendon repair..  Patient is to receive empiric Clindamycin for empiric post- procedure prophylaxis.   Patient has been on Cephalexin prior to admission.  Wt 65.8 kg,  CrCl pending  PLAN:  1. Begin Clindamycin 600 mg IV q 8 hours.  No dosing adjustments required nor anticipated. 2. Pharmacy will sign off. Please reconsult if additional assistance is needed.   Thank you for allowing Pharmacy to participate in this patient's care.   Laurel Harnden, Elisha HeadlandEarle J,  Pharm.D.,  02/16/2015,  9:02 PM

## 2015-02-16 NOTE — Transfer of Care (Signed)
Immediate Anesthesia Transfer of Care Note  Patient: Melissa Montgomery  Procedure(s) Performed: Procedure(s): RIGHT MIDDLE FINGER WOUND EXPLORATION (Right) NERVE AND TENDON REPAIR (Right)  Patient Location: PACU  Anesthesia Type:General  Level of Consciousness: awake, alert  and oriented  Airway & Oxygen Therapy: Patient Spontanous Breathing and Patient connected to nasal cannula oxygen  Post-op Assessment: Report given to RN and Post -op Vital signs reviewed and stable  Post vital signs: Reviewed and stable  Last Vitals:  Filed Vitals:   02/16/15 1606  BP: 119/78  Pulse: 79  Temp: 37 C  Resp: 16    Complications: No apparent anesthesia complications

## 2015-02-16 NOTE — Anesthesia Postprocedure Evaluation (Signed)
Anesthesia Post Note  Patient: Melissa Montgomery  Procedure(s) Performed: Procedure(s) (LRB): RIGHT MIDDLE FINGER WOUND EXPLORATION (Right) NERVE AND TENDON REPAIR (Right)  Anesthesia type: general  Patient location: PACU  Post pain: Pain level controlled  Post assessment: Patient's Cardiovascular Status Stable  Last Vitals:  Filed Vitals:   02/16/15 2057  BP: 99/67  Pulse: 71  Temp: 36.6 C  Resp: 16    Post vital signs: Reviewed and stable  Level of consciousness: sedated  Complications: No apparent anesthesia complications

## 2015-02-16 NOTE — Anesthesia Preprocedure Evaluation (Addendum)
Anesthesia Evaluation  Patient identified by MRN, date of birth, ID band Patient awake    Reviewed: Allergy & Precautions, NPO status , Patient's Chart, lab work & pertinent test results  Airway Mallampati: II  TM Distance: >3 FB Neck ROM: Full    Dental  (+) Teeth Intact, Dental Advisory Given   Pulmonary Current Smoker,  breath sounds clear to auscultation        Cardiovascular Rhythm:Regular Rate:Normal     Neuro/Psych    GI/Hepatic GERD-  Medicated and Controlled,  Endo/Other    Renal/GU      Musculoskeletal  (+) Arthritis -, Fibromyalgia -  Abdominal   Peds  Hematology   Anesthesia Other Findings   Reproductive/Obstetrics                       Anesthesia Physical Anesthesia Plan  ASA: II  Anesthesia Plan: General   Post-op Pain Management:    Induction: Intravenous  Airway Management Planned: LMA  Additional Equipment:   Intra-op Plan:   Post-operative Plan: Extubation in OR  Informed Consent: I have reviewed the patients History and Physical, chart, labs and discussed the procedure including the risks, benefits and alternatives for the proposed anesthesia with the patient or authorized representative who has indicated his/her understanding and acceptance.   Dental advisory given  Plan Discussed with: Anesthesiologist, Surgeon and CRNA  Anesthesia Plan Comments:        Anesthesia Quick Evaluation

## 2015-02-17 ENCOUNTER — Encounter (HOSPITAL_COMMUNITY): Payer: Self-pay | Admitting: Orthopedic Surgery

## 2015-02-17 NOTE — Discharge Summary (Signed)
Physician Discharge Summary  Patient ID: Melissa Montgomery MRN: [id number] DOB/AGE: April 01, 1981 34 y.o.  Admit date: 02/16/2015 Discharge date: 02/17/2015  Admission Diagnoses: RIGHT MIDDLE FINGER LACERATION WITH TENDON NERVE  Past Medical History  Diagnosis Date  . Fibromyalgia   . Hypoglycemia   . Anemia   . Pneumonia   . Anxiety   . GERD (gastroesophageal reflux disease)   . Arthritis     Discharge Diagnoses:  Active Problems:   Hand laceration involving tendon   Surgeries: Procedure(s): RIGHT MIDDLE FINGER WOUND EXPLORATION NERVE AND TENDON REPAIR on 02/16/2015    Consultants:  none  Discharged Condition: Improved  Hospital Course: Melissa BenStacey Hipps is an 34 y.o. female who was admitted 02/16/2015 with a chief complaint of No chief complaint on file. , and found to have a diagnosis of RIGHT MIDDLE FINGER LACERATION WITH TENDON NERVE .  They were brought to the operating room on 02/16/2015 and underwent Procedure(s): RIGHT MIDDLE FINGER WOUND EXPLORATION NERVE AND TENDON REPAIR.    They were given perioperative antibiotics: Anti-infectives    Start     Dose/Rate Route Frequency Ordered Stop   02/17/15 0000  clindamycin (CLEOCIN) IVPB 600 mg     600 mg 100 mL/hr over 30 Minutes Intravenous Every 8 hours 02/16/15 2111     02/16/15 0600  clindamycin (CLEOCIN) IVPB 900 mg     900 mg 100 mL/hr over 30 Minutes Intravenous On call to O.R. 02/15/15 2149 02/16/15 1830    .  They were given sequential compression devices, early ambulation, and Other (comment) for DVT prophylaxis.  Recent vital signs: Patient Vitals for the past 24 hrs:  BP Temp Temp src Pulse Resp SpO2 Height Weight  02/17/15 0500 102/66 mmHg - - - - - - -  02/17/15 0443 (!) 87/56 mmHg 98.2 F (36.8 C) Oral 65 18 96 % - -  02/16/15 2057 99/67 mmHg 97.8 F (36.6 C) Oral 71 16 98 % - -  02/16/15 2025 (!) 109/57 mmHg - - 63 14 97 % - -  02/16/15 2010 (!) 110/50 mmHg - - 67 14 97 % - -  02/16/15 1955 (!) 106/51 mmHg - -  66 11 98 % - -  02/16/15 1945 - - - 66 (!) 7 95 % - -  02/16/15 1941 (!) 112/56 mmHg 98.8 F (37.1 C) - 68 10 98 % - -  02/16/15 1606 119/78 mmHg 98.6 F (37 C) Oral 79 16 100 % - -  02/16/15 1550 - - - - - - 5\' 2"  (1.575 m) 65.772 kg (145 lb)  .  Recent laboratory studies: No results found.  Discharge Medications:     Medication List    TAKE these medications        cephALEXin 500 MG capsule  Commonly known as:  KEFLEX  Take 1 capsule (500 mg total) by mouth 4 (four) times daily.     gabapentin 300 MG capsule  Commonly known as:  NEURONTIN  Take 300 mg by mouth 4 (four) times daily.     Hydrocodone-Acetaminophen 5-300 MG Tabs  Commonly known as:  VICODIN  Take 1 tablet by mouth 4 (four) times daily as needed (PAIN).        Diagnostic Studies: Dg Finger Middle Right  02/14/2015   CLINICAL DATA:  Laceration to the right middle finger with metal. Initial encounter.  EXAM: RIGHT MIDDLE FINGER 2+V  COMPARISON:  None.  FINDINGS: There is no evidence of fracture or dislocation. A soft tissue  defect is noted volar to the third proximal interphalangeal joint. No radiopaque foreign bodies are seen. The joint space is not fully characterized. Visualized joint spaces appear grossly intact.  IMPRESSION: No evidence of fracture or dislocation. No radiopaque foreign bodies seen.   Electronically Signed   By: Roanna Raider M.D.   On: 02/14/2015 17:21    They benefited maximally from their hospital stay and there were no complications.     Disposition: 01-Home or Self Care      Follow-up Information    Follow up with Sharma Covert, MD. Schedule an appointment as soon as possible for a visit in 2 weeks.   Specialty:  Orthopedic Surgery   Contact information:   [address] [place] [address] [phone]        Signed: Sharma Covert 02/17/2015, 8:32 AM

## 2015-02-17 NOTE — Discharge Planning (Signed)
Copy of AVS to pt who verbalizes understanding. Declines rx for vicodin,stating she is allergic to this. Offered to call MD and get a different rx and she declines.  Instructed pt to call MD office if pain should increase after she is discharged.  Will dc to taxi with all personal belongings, going to hotel until Monday.

## 2015-02-17 NOTE — Op Note (Signed)
Melissa Montgomery, NEGRON NO.:  [id number]  MEDICAL RECORD NO.:  [id number]  LOCATION:  6N01C                        FACILITY:  [facility]  PHYSICIAN:  Madelynn Done, MD  DATE OF BIRTH:  06/23/1981  DATE OF PROCEDURE:  02/16/2015 DATE OF DISCHARGE:                              OPERATIVE REPORT   PREOPERATIVE DIAGNOSIS:  Right little finger laceration with nerve involvement.  POSTOPERATIVE DIAGNOSIS:  Right little finger laceration with nerve involvement.  ATTENDING PHYSICIAN:  Sharma Covert IV, MD, who scrubbed and present for the entire procedure.  ASSISTANT SURGEON:  None.  ANESTHESIA:  General via LMA.  SURGICAL PROCEDURES: 1. Right middle finger ulnar digital nerve neuroplasty. 2. 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 3. Right long finger radial digital nerve neuroplasty and exploration. 4,  Right long finger flexor sheath exploration and drainage. 1. Right long finger traumatic laceration repair of 2 cm.  SURGICAL INDICATIONS:  Ms. Lightle is a right-hand dominant female, who sustained a sharp laceration to the volar aspect of the long finger. The patient presented to the office with the known injury over the zone 2 of the flexor sheath.  The patient was recommended to undergo the above procedure.  Risks, benefits, and alternatives were discussed in detail with the patient.  Signed informed consent was obtained.  Risks include, but not limited to bleeding, infection, damage to nearby nerves, arteries, or tendons; loss motion of the wrist and digits, incomplete relief of symptoms, and need for further surgical intervention.  DESCRIPTION OF PROCEDURE:  The patient was properly identified in the preop holding area and marked with a permanent marker made on right long finger to indicate the correct operative site.  The patient was then brought back to the operating room and placed supine on the anesthesia room table, where  general anesthesia was administered.  The patient received preoperative antibiotics prior to skin incision.  A well-padded tourniquet was placed on the right brachium and sealed with 1000-drape. The right upper extremity was then prepped and draped in the normal sterile fashion.  Time-out was called and the correct site was identified and the procedure was then begun.  Attention then turned to the right long finger.  However, the transverse incision was extended both proximally and distally.  Pertinent flaps were then raised and skin flaps were then raised accordingly.  Neuroplasty of the radial digital nerve was then carried out and nerve was incontinuity.  Decompression and careful dissection of the nerve and artery was then carried out. Following this, the flexor sheath __________ partial laceration to the distal edge of the A2 pulley, just at the level of the cruciate pulley. It was then opened up and explored.  The FDP and FDS were both incontinuity.  The flexor sheath was then thoroughly irrigated and drainage of the flexor sheath was then carried out.  Following this, the ulnar digital nerve was then carefully isolated.  The ulnar digital nerve was then isolated both proximally and distally and then repaired with 8-0 nylon suture.  A 3-0 epineural sutures were then repaired with good reapproximation of the nerve under loupe magnification.  The wound was then thoroughly  irrigated.  The traumatic laceration was repaired with 4-0 Vicryl.  Deep sutures being radius proximal and distal incisions were then closed with 4-0 Vicryl.  A 7 mL of 0.25% Marcaine infiltrated locally.  Adaptic dressing and sterile compressive bandage then applied.  The patient was placed in a small finger splint and extubated and taken to recovery room in good condition.  POSTPROCEDURE PLAN:  The patient discharged home in the morning given the social circumstances, seen back in the office in approximately  10-14 days for wound check, and then gradually use and activity given her occupation.     Madelynn DoneFred W Pranav Lince IV, MD     FWO/MEDQ  D:  02/16/2015  T:  02/17/2015  Job:  [id number]

## 2015-02-18 ENCOUNTER — Encounter (HOSPITAL_COMMUNITY): Payer: Self-pay | Admitting: Orthopedic Surgery

## 2016-05-24 IMAGING — DX DG FINGER MIDDLE 2+V*R*
3 series · 3 of 3 positions shown · non-contrast
Comparison: None.

CLINICAL DATA: Laceration to the right middle finger with metal.
Initial encounter.

EXAM:
RIGHT MIDDLE FINGER 2+V

[finger ap]
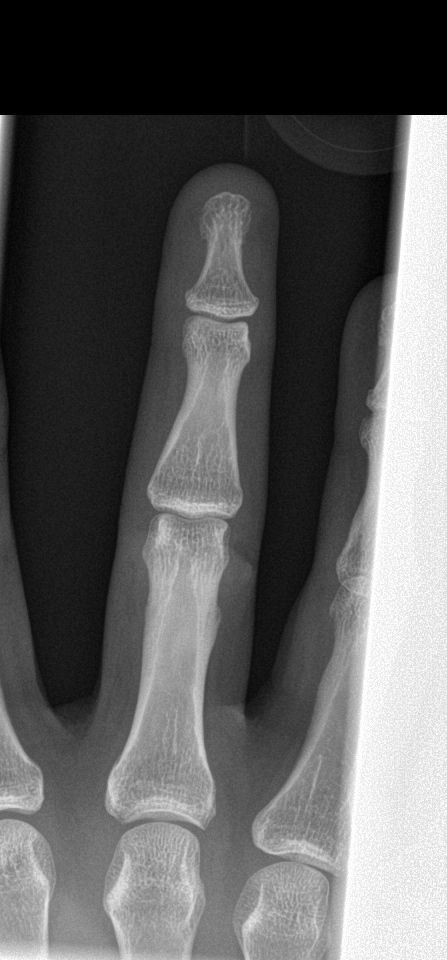

[finger obl]
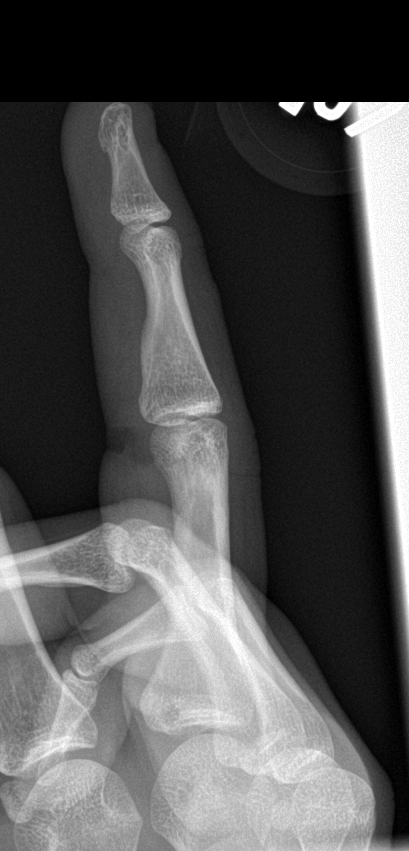

[finger lat]
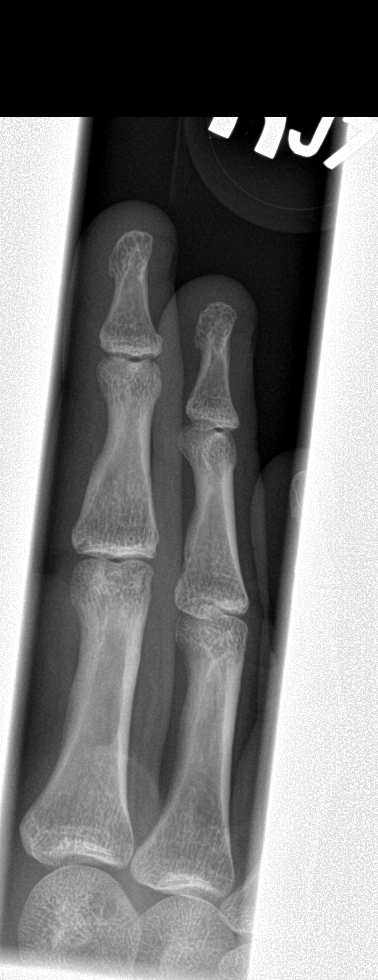

[3 of 3 positions shown; findings below may reference images not displayed]

FINDINGS: There is no evidence of fracture or dislocation. A soft tissue
defect is noted volar to the third proximal interphalangeal joint.
No radiopaque foreign bodies are seen. The joint space is not fully
characterized. Visualized joint spaces appear grossly intact.
IMPRESSION: No evidence of fracture or dislocation. No radiopaque foreign bodies
seen.
